# Patient Record
Sex: Female | Born: 1965 | Race: White | Hispanic: No | Marital: Single | Smoking: Never smoker
Health system: Southern US, Community
[De-identification: ages and names within clinical notes are randomized; demographics above are authoritative.]

## PROBLEM LIST (undated history)

## (undated) DIAGNOSIS — R079 Chest pain, unspecified: Secondary | ICD-10-CM

## (undated) DIAGNOSIS — R0600 Dyspnea, unspecified: Secondary | ICD-10-CM

## (undated) DIAGNOSIS — J40 Bronchitis, not specified as acute or chronic: Secondary | ICD-10-CM

## (undated) DIAGNOSIS — G43909 Migraine, unspecified, not intractable, without status migrainosus: Secondary | ICD-10-CM

## (undated) DIAGNOSIS — G709 Myoneural disorder, unspecified: Secondary | ICD-10-CM

## (undated) DIAGNOSIS — B009 Herpesviral infection, unspecified: Secondary | ICD-10-CM

## (undated) DIAGNOSIS — A6000 Herpesviral infection of urogenital system, unspecified: Secondary | ICD-10-CM

## (undated) HISTORY — DX: Migraine, unspecified, not intractable, without status migrainosus: G43.909

## (undated) HISTORY — PX: WISDOM TOOTH EXTRACTION: SHX21

## (undated) HISTORY — DX: Bronchitis, not specified as acute or chronic: J40

## (undated) HISTORY — DX: Herpesviral infection, unspecified: B00.9

---

## 2005-01-07 ENCOUNTER — Ambulatory Visit: Payer: Self-pay | Admitting: Obstetrics & Gynecology

## 2005-03-02 ENCOUNTER — Ambulatory Visit: Payer: Self-pay | Admitting: Unknown Physician Specialty

## 2006-05-30 ENCOUNTER — Ambulatory Visit: Payer: Self-pay | Admitting: Unknown Physician Specialty

## 2007-08-01 ENCOUNTER — Ambulatory Visit: Payer: Self-pay | Admitting: Unknown Physician Specialty

## 2007-08-13 ENCOUNTER — Ambulatory Visit: Payer: Self-pay | Admitting: Unknown Physician Specialty

## 2009-08-11 ENCOUNTER — Ambulatory Visit: Payer: Self-pay | Admitting: Unknown Physician Specialty

## 2010-08-17 ENCOUNTER — Ambulatory Visit: Payer: Self-pay | Admitting: Unknown Physician Specialty

## 2011-08-01 ENCOUNTER — Ambulatory Visit: Payer: Self-pay

## 2013-04-26 HISTORY — PX: INCONTINENCE SURGERY: SHX676

## 2014-02-04 ENCOUNTER — Ambulatory Visit: Payer: Self-pay | Admitting: Physician Assistant

## 2015-06-16 ENCOUNTER — Other Ambulatory Visit: Payer: Self-pay | Admitting: Physician Assistant

## 2015-06-16 DIAGNOSIS — R109 Unspecified abdominal pain: Secondary | ICD-10-CM

## 2015-06-19 ENCOUNTER — Ambulatory Visit: Payer: Self-pay

## 2015-06-22 ENCOUNTER — Other Ambulatory Visit: Payer: Self-pay | Admitting: Physician Assistant

## 2015-06-22 DIAGNOSIS — R1013 Epigastric pain: Secondary | ICD-10-CM

## 2015-06-23 ENCOUNTER — Ambulatory Visit: Admission: RE | Admit: 2015-06-23 | Payer: 59 | Source: Ambulatory Visit

## 2015-07-01 ENCOUNTER — Ambulatory Visit: Payer: 59

## 2015-07-07 ENCOUNTER — Ambulatory Visit
Admission: RE | Admit: 2015-07-07 | Discharge: 2015-07-07 | Disposition: A | Discharge status: Home / Self Care | Payer: 59 | Source: Ambulatory Visit | Attending: Physician Assistant | Admitting: Physician Assistant

## 2015-07-07 DIAGNOSIS — R932 Abnormal findings on diagnostic imaging of liver and biliary tract: Secondary | ICD-10-CM | POA: Insufficient documentation

## 2015-07-07 DIAGNOSIS — R1013 Epigastric pain: Secondary | ICD-10-CM | POA: Insufficient documentation

## 2016-04-25 ENCOUNTER — Other Ambulatory Visit: Payer: Self-pay | Admitting: Obstetrics and Gynecology

## 2016-04-25 MED ORDER — VALACYCLOVIR HCL 500 MG PO TABS: ORAL_TABLET | ORAL | 0 refills | Status: DC

## 2016-04-25 NOTE — Progress Notes (Unsigned)
valacy

## 2016-05-02 ENCOUNTER — Ambulatory Visit: Payer: Self-pay | Admitting: Obstetrics and Gynecology

## 2016-05-09 DIAGNOSIS — G43909 Migraine, unspecified, not intractable, without status migrainosus: Secondary | ICD-10-CM

## 2016-05-09 DIAGNOSIS — R0602 Shortness of breath: Secondary | ICD-10-CM | POA: Insufficient documentation

## 2016-05-09 DIAGNOSIS — R0600 Dyspnea, unspecified: Secondary | ICD-10-CM

## 2016-05-09 DIAGNOSIS — R079 Chest pain, unspecified: Secondary | ICD-10-CM | POA: Insufficient documentation

## 2016-05-09 HISTORY — DX: Dyspnea, unspecified: R06.00

## 2016-05-09 HISTORY — DX: Migraine, unspecified, not intractable, without status migrainosus: G43.909

## 2016-05-17 ENCOUNTER — Ambulatory Visit (INDEPENDENT_AMBULATORY_CARE_PROVIDER_SITE_OTHER): Payer: 59 | Admitting: Obstetrics and Gynecology

## 2016-05-17 ENCOUNTER — Encounter: Payer: Self-pay | Admitting: Obstetrics and Gynecology

## 2016-05-17 VITALS — BP 110/80 | HR 78 | Ht 70.0 in | Wt 225.0 lb

## 2016-05-17 DIAGNOSIS — Z1239 Encounter for other screening for malignant neoplasm of breast: Secondary | ICD-10-CM

## 2016-05-17 DIAGNOSIS — G43019 Migraine without aura, intractable, without status migrainosus: Secondary | ICD-10-CM | POA: Insufficient documentation

## 2016-05-17 DIAGNOSIS — Z01419 Encounter for gynecological examination (general) (routine) without abnormal findings: Secondary | ICD-10-CM

## 2016-05-17 DIAGNOSIS — Z1211 Encounter for screening for malignant neoplasm of colon: Secondary | ICD-10-CM

## 2016-05-17 DIAGNOSIS — A6004 Herpesviral vulvovaginitis: Secondary | ICD-10-CM

## 2016-05-17 DIAGNOSIS — Z1231 Encounter for screening mammogram for malignant neoplasm of breast: Secondary | ICD-10-CM

## 2016-05-17 LAB — HEMOCCULT GUIAC POC 1CARD (OFFICE): Fecal Occult Blood, POC: NEGATIVE

## 2016-05-17 MED ORDER — VALACYCLOVIR HCL 500 MG PO TABS: ORAL_TABLET | ORAL | 12 refills | Status: AC

## 2016-05-17 NOTE — Progress Notes (Signed)
HPI:      Ms. SHAYANNE GOMM is a 51 y.o. No obstetric history on file. who LMP was Patient's last menstrual period was 01/11/2016., presents today for her annual examination.  Her menses are absent since 11/17. She does not have intermenstrual bleeding.  She does not have vasomotor sx.   Sex activity: single partner. She does not have vaginal dryness. She can feel something vaginally when she strains for BM or during sex. It is not painful. She denies incontinence unless she waits too long to void. She is s/p bladder tack with mesh surgery about 3 yrs ago.  Last Pap: March 17, 2015  Results were: no abnormalities /neg HPV DNA.  Hx of STDs: HSV. She takes valtrex 1000 mg daily (but likes to take 2-500 mg tabs instead of 02-998 mg tab)  Last mammogram: March 17, 2015  Results were: normal--routine follow-up in 12 months There is no FH of breast cancer. There is no FH of ovarian cancer. The patient does do self-breast exams.  Colonoscopy: never. Pt interested in referrral.   Tobacco use: The patient denies current or previous tobacco use. Alcohol use: none Exercise: not active  She does get adequate calcium and Vitamin D in her diet.    Past Medical History:  Diagnosis Date  . Bronchitis   . Herpes   . Migraine     Past Surgical History:  Procedure Laterality Date  . CESAREAN SECTION      Family History  Problem Relation Age of Onset  . Coronary artery disease Father   . Prostate cancer Father 38  . Lung cancer Maternal Grandmother 78  . Heart attack Paternal Grandfather   . Brain cancer Paternal Grandmother 66     ROS:  Review of Systems  Constitutional: Negative for fever, malaise/fatigue and weight loss.  HENT: Negative for congestion, ear pain and sinus pain.   Respiratory: Negative for cough, shortness of breath and wheezing.   Cardiovascular: Negative for chest pain, orthopnea and leg swelling.  Gastrointestinal: Negative for constipation, diarrhea,  nausea and vomiting.  Genitourinary: Negative for dysuria, frequency, hematuria and urgency.       Breast ROS: negative   Musculoskeletal: Negative for back pain, joint pain and myalgias.  Skin: Negative for itching and rash.  Neurological: Negative for dizziness, tingling, focal weakness and headaches.  Endo/Heme/Allergies: Negative for environmental allergies. Does not bruise/bleed easily.  Psychiatric/Behavioral: Negative for depression and suicidal ideas. The patient is not nervous/anxious and does not have insomnia.     Objective: BP 110/80   Pulse 78   Ht 5\' 10"  (1.778 m)   Wt 225 lb (102.1 kg)   LMP 01/11/2016   BMI 32.28 kg/m    Physical Exam  Constitutional: She is oriented to person, place, and time. She appears well-developed and well-nourished.  Genitourinary: Vagina normal and uterus normal. No erythema or tenderness in the vagina. No vaginal discharge found. Right adnexum does not display mass and does not display tenderness. Left adnexum does not display mass and does not display tenderness. Cervix does not exhibit motion tenderness or polyp. Uterus is not enlarged or tender. Rectal exam shows no external hemorrhoid and guaiac negative stool.  Genitourinary Comments: Grade 1 cystocele with valsalva  Neck: Normal range of motion. No thyromegaly present.  Cardiovascular: Normal rate, regular rhythm and normal heart sounds.   No murmur heard. Pulmonary/Chest: Effort normal and breath sounds normal. Right breast exhibits no mass, no nipple discharge, no skin change and  no tenderness. Left breast exhibits no mass, no nipple discharge, no skin change and no tenderness.  Abdominal: Soft. There is no tenderness. There is no guarding.  Musculoskeletal: Normal range of motion.  Neurological: She is alert and oriented to person, place, and time. No cranial nerve deficit.  Psychiatric: She has a normal mood and affect. Her behavior is normal.  Vitals  reviewed.   Results: Results for orders placed or performed in visit on 05/17/16  POCT Occult Blood Stool  Result Value Ref Range   Fecal Occult Blood, POC Negative Negative   Card #1 Date     Card #2 Fecal Occult Blod, POC     Card #2 Date     Card #3 Fecal Occult Blood, POC     Card #3 Date      Assessment/Plan:  Encounter for annual routine gynecological examination  Screening for breast cancer - Plan: MM SCREENING BREAST TOMO BILATERAL  Herpes simplex vulvovaginitis - Rx RF valtrex. Pt takes 1-2 tabs daily as preventive. - Plan: valACYclovir (VALTREX) 500 MG tablet  Screening for colon cancer - Neg FOBT. Refer to GI for scr colonoscopy due to age. - Plan: POCT Occult Blood Stool, Ambulatory referral to Gastroenterology           GYN counsel mammography screening, menopause, adequate intake of calcium and vitamin D     F/U  Return in about 1 year (around 05/17/2017).  Alicia B. Copland, PA-C 05/17/2016 12:04 PM

## 2017-04-10 ENCOUNTER — Encounter: Payer: Self-pay | Admitting: Anesthesiology

## 2017-04-10 ENCOUNTER — Ambulatory Visit: Payer: 59 | Admitting: Anesthesiology

## 2017-04-10 ENCOUNTER — Ambulatory Visit
Admission: RE | Admit: 2017-04-10 | Discharge: 2017-04-10 | Disposition: A | Discharge status: Home / Self Care | Payer: 59 | Source: Ambulatory Visit | Attending: Unknown Physician Specialty | Admitting: Unknown Physician Specialty

## 2017-04-10 ENCOUNTER — Encounter
Admission: RE | Disposition: A | Discharge status: Home / Self Care | Payer: Self-pay | Source: Ambulatory Visit | Attending: Unknown Physician Specialty

## 2017-04-10 DIAGNOSIS — K640 First degree hemorrhoids: Secondary | ICD-10-CM | POA: Diagnosis not present

## 2017-04-10 DIAGNOSIS — Z79899 Other long term (current) drug therapy: Secondary | ICD-10-CM | POA: Insufficient documentation

## 2017-04-10 DIAGNOSIS — Z8371 Family history of colonic polyps: Secondary | ICD-10-CM | POA: Diagnosis not present

## 2017-04-10 DIAGNOSIS — D175 Benign lipomatous neoplasm of intra-abdominal organs: Secondary | ICD-10-CM | POA: Insufficient documentation

## 2017-04-10 DIAGNOSIS — Z882 Allergy status to sulfonamides status: Secondary | ICD-10-CM | POA: Insufficient documentation

## 2017-04-10 DIAGNOSIS — Z1211 Encounter for screening for malignant neoplasm of colon: Secondary | ICD-10-CM | POA: Insufficient documentation

## 2017-04-10 DIAGNOSIS — K635 Polyp of colon: Secondary | ICD-10-CM | POA: Diagnosis not present

## 2017-04-10 DIAGNOSIS — D122 Benign neoplasm of ascending colon: Secondary | ICD-10-CM | POA: Diagnosis not present

## 2017-04-10 DIAGNOSIS — A6000 Herpesviral infection of urogenital system, unspecified: Secondary | ICD-10-CM | POA: Insufficient documentation

## 2017-04-10 DIAGNOSIS — Z8249 Family history of ischemic heart disease and other diseases of the circulatory system: Secondary | ICD-10-CM | POA: Insufficient documentation

## 2017-04-10 HISTORY — DX: Dyspnea, unspecified: R06.00

## 2017-04-10 HISTORY — PX: COLONOSCOPY WITH PROPOFOL: SHX5780

## 2017-04-10 HISTORY — DX: Myoneural disorder, unspecified: G70.9

## 2017-04-10 HISTORY — DX: Herpesviral infection of urogenital system, unspecified: A60.00

## 2017-04-10 HISTORY — DX: Chest pain, unspecified: R07.9

## 2017-04-10 LAB — POCT PREGNANCY, URINE: PREG TEST UR: NEGATIVE

## 2017-04-10 SURGERY — COLONOSCOPY WITH PROPOFOL
Anesthesia: General

## 2017-04-10 MED ORDER — SODIUM CHLORIDE 0.9 % IV SOLN: INTRAVENOUS | Status: DC

## 2017-04-10 MED ORDER — MIDAZOLAM HCL 5 MG/5ML IJ SOLN
INTRAMUSCULAR | Status: DC | PRN
  Administered 2017-04-10 (×2): 2 mg via INTRAVENOUS

## 2017-04-10 MED ORDER — PROPOFOL 500 MG/50ML IV EMUL
INTRAVENOUS | Status: DC | PRN
  Administered 2017-04-10: 50 ug/kg/min via INTRAVENOUS

## 2017-04-10 MED ORDER — LIDOCAINE HCL (PF) 1 % IJ SOLN
INTRAMUSCULAR | Status: AC
  Administered 2017-04-10: 0.3 mL via INTRADERMAL
  Filled 2017-04-10: qty 2

## 2017-04-10 MED ORDER — FENTANYL CITRATE (PF) 100 MCG/2ML IJ SOLN
INTRAMUSCULAR | Status: DC | PRN
  Administered 2017-04-10 (×2): 50 ug via INTRAVENOUS

## 2017-04-10 MED ORDER — PROPOFOL 500 MG/50ML IV EMUL
INTRAVENOUS | Status: AC
  Filled 2017-04-10: qty 50

## 2017-04-10 MED ORDER — FENTANYL CITRATE (PF) 100 MCG/2ML IJ SOLN
INTRAMUSCULAR | Status: AC
  Filled 2017-04-10: qty 2

## 2017-04-10 MED ORDER — LIDOCAINE HCL (PF) 1 % IJ SOLN
2.0000 mL | Freq: Once | INTRAMUSCULAR | Status: AC
  Administered 2017-04-10: 0.3 mL via INTRADERMAL

## 2017-04-10 MED ORDER — MIDAZOLAM HCL 2 MG/2ML IJ SOLN
INTRAMUSCULAR | Status: AC
  Filled 2017-04-10: qty 2

## 2017-04-10 MED ORDER — EPHEDRINE SULFATE 50 MG/ML IJ SOLN
INTRAMUSCULAR | Status: DC | PRN
  Administered 2017-04-10: 5 mg via INTRAVENOUS
  Administered 2017-04-10: 10 mg via INTRAVENOUS

## 2017-04-10 MED ORDER — LIDOCAINE HCL (PF) 2 % IJ SOLN
INTRAMUSCULAR | Status: AC
  Filled 2017-04-10: qty 10

## 2017-04-10 MED ORDER — LIDOCAINE HCL (PF) 2 % IJ SOLN
INTRAMUSCULAR | Status: DC | PRN
  Administered 2017-04-10: 60 mg

## 2017-04-10 MED ORDER — PROPOFOL 10 MG/ML IV BOLUS
INTRAVENOUS | Status: DC | PRN
  Administered 2017-04-10: 30 mg via INTRAVENOUS
  Administered 2017-04-10 (×2): 20 mg via INTRAVENOUS
  Administered 2017-04-10: 30 mg via INTRAVENOUS

## 2017-04-10 MED ORDER — SODIUM CHLORIDE 0.9 % IV SOLN
INTRAVENOUS | Status: DC
  Administered 2017-04-10: 1000 mL via INTRAVENOUS

## 2017-04-10 NOTE — Anesthesia Post-op Follow-up Note (Signed)
Anesthesia QCDR form completed.        

## 2017-04-10 NOTE — Op Note (Signed)
[facility] Patient Name: Tracy Miller Procedure Date: 04/10/2017 12:17 PM MRN: [id number] Account #: [id number] Date of Birth: 23-Jun-1965 Admit Type: Outpatient Age: 52 Room: [facility] ENDO ROOM 1 Gender: Female Note Status: Finalized Procedure:            Colonoscopy Indications:          Colon cancer screening in patient at increased risk:                        Family history of 1st-degree relative with colon polyps Providers:            Manya Silvas, MD Referring MD:         Precious Bard, MD (Referring MD) Medicines:            Propofol per Anesthesia Complications:        No immediate complications. Procedure:            Pre-Anesthesia Assessment:                       - After reviewing the risks and benefits, the patient                        was deemed in satisfactory condition to undergo the                        procedure.                       After obtaining informed consent, the colonoscope was                        passed under direct vision. Throughout the procedure,                        the patient's blood pressure, pulse, and oxygen                        saturations were monitored continuously. The                        Colonoscope was introduced through the anus and                        advanced to the the cecum, identified by appendiceal                        orifice and ileocecal valve. The colonoscopy was                        performed without difficulty. The patient tolerated the                        procedure well. The quality of the bowel preparation                        was good. Findings:      A diminutive polyp was found in the proximal ascending colon. The polyp       was sessile. The polyp was removed with a jumbo cold forceps. Resection       and retrieval were complete.  A small polyp was found in the descending colon. The polyp was sessile.       The polyp was removed with a hot snare.  Resection and retrieval were       complete.      One small nodule was found in the sigmoid colon. Biopsies were taken       with a cold forceps for histology.      Internal hemorrhoids were found during endoscopy. The hemorrhoids were       small and Grade I (internal hemorrhoids that do not prolapse).      The exam was otherwise without abnormality. Impression:           - One diminutive polyp in the proximal ascending colon,                        removed with a jumbo cold forceps. Resected and                        retrieved.                       - One small polyp in the descending colon, removed with                        a hot snare. Resected and retrieved.                       - Nodule in the sigmoid colon. Biopsied.                       - Internal hemorrhoids.                       - The examination was otherwise normal. Recommendation:       - Await pathology results. Manya Silvas, MD 04/10/2017 12:59:37 PM This report has been signed electronically. Number of Addenda: 0 Note Initiated On: 04/10/2017 12:17 PM Scope Withdrawal Time: 0 hours 19 minutes 24 seconds  Total Procedure Duration: 0 hours 29 minutes 18 seconds       [facility]

## 2017-04-10 NOTE — Anesthesia Preprocedure Evaluation (Addendum)
Anesthesia Evaluation  Patient identified by MRN, date of birth, ID band Patient awake    Reviewed: Allergy & Precautions, NPO status , Patient's Chart, lab work & pertinent test results, reviewed documented beta blocker date and time   Airway Mallampati: III  TM Distance: >3 FB     Dental  (+) Chipped, Missing   Pulmonary shortness of breath,           Cardiovascular      Neuro/Psych  Headaches,  Neuromuscular disease    GI/Hepatic   Endo/Other    Renal/GU      Musculoskeletal   Abdominal   Peds  Hematology   Anesthesia Other Findings One missing tooth. Does not take NTG.  Reproductive/Obstetrics                            Anesthesia Physical Anesthesia Plan  ASA: III  Anesthesia Plan: General   Post-op Pain Management:    Induction: Intravenous  PONV Risk Score and Plan:   Airway Management Planned:   Additional Equipment:   Intra-op Plan:   Post-operative Plan:   Informed Consent: I have reviewed the patients History and Physical, chart, labs and discussed the procedure including the risks, benefits and alternatives for the proposed anesthesia with the patient or authorized representative who has indicated his/her understanding and acceptance.     Plan Discussed with: CRNA  Anesthesia Plan Comments:         Anesthesia Quick Evaluation

## 2017-04-10 NOTE — Anesthesia Postprocedure Evaluation (Signed)
Anesthesia Post Note  Patient: Tracy Miller  Procedure(s) Performed: COLONOSCOPY WITH PROPOFOL (N/A )  Patient location during evaluation: Endoscopy Anesthesia Type: General Level of consciousness: awake and alert Pain management: pain level controlled Vital Signs Assessment: post-procedure vital signs reviewed and stable Respiratory status: spontaneous breathing, nonlabored ventilation, respiratory function stable and patient connected to nasal cannula oxygen Cardiovascular status: blood pressure returned to baseline and stable Postop Assessment: no apparent nausea or vomiting Anesthetic complications: no     Last Vitals:  Vitals:   04/10/17 1318 04/10/17 1328  BP: 115/79 122/80  Pulse: 65 67  Resp: 15 16  Temp:    SpO2: 99% 97%    Last Pain:  Vitals:   04/10/17 1258  TempSrc: Tympanic                 Klea Nall S

## 2017-04-10 NOTE — Transfer of Care (Addendum)
Immediate Anesthesia Transfer of Care Note  Patient: Tracy Miller  Procedure(s) Performed: COLONOSCOPY WITH PROPOFOL (N/A )  Patient Location: PACU  Anesthesia Type:General  Level of Consciousness: sedated  Airway & Oxygen Therapy: Patient Spontanous Breathing and Patient connected to nasal cannula oxygen  Post-op Assessment: Report given to RN and Post -op Vital signs reviewed and stable  Post vital signs: Reviewed and stable  Last Vitals:  Vitals:   04/10/17 1140 04/10/17 1141  BP:  127/82  Pulse:  70  Resp: 16   Temp:  (!) 36.1 C  SpO2:  100%    Last Pain: There were no vitals filed for this visit.       Complications: No apparent anesthesia complications

## 2017-04-10 NOTE — H&P (Signed)
Primary Care Physician:  Marinda Elk, MD Primary Gastroenterologist:  Dr. Vira Agar  Pre-Procedure History & Physical: HPI:  Tracy Miller is a 52 y.o. female is here for an colonoscopy.  Done for family history of colon polyps.   Past Medical History:  Diagnosis Date  . Bronchitis   . Chest pain syndrome   . Dyspnea 05/09/2016  . Genital herpes   . Herpes   . Migraine 05/09/2016  . Neuromuscular disorder ([place])    Migraine    Past Surgical History:  Procedure Laterality Date  . CESAREAN SECTION    . INCONTINENCE SURGERY  04/26/2013   cystoscopy with ureteroscopy  . WISDOM TOOTH EXTRACTION      Prior to Admission medications   Medication Sig Start Date End Date Taking? Authorizing Provider  BIOTIN PO Take by mouth.   Yes [provider]  cyclobenzaprine (FLEXERIL) 5 MG tablet Take 5 mg by mouth 2 (two) times daily as needed for muscle spasms.   Yes [provider]  ibuprofen (ADVIL,MOTRIN) 600 MG tablet Take 600 mg by mouth every 6 (six) hours as needed.   Yes [provider]  Multiple Vitamin (MULTI-VITAMIN DAILY PO) Take by mouth.   Yes [provider]  nitroGLYCERIN (NITROSTAT) 0.4 MG SL tablet  05/09/16  Yes [provider]  TURMERIC PO Take 1 capsule by mouth daily.   Yes [provider]  valACYclovir (VALTREX) 500 MG tablet Take 1-2 tabs daily 07/17/76  Yes Copland, Alicia B, PA-C  Cholecalciferol (VITAMIN D3) 1000 units CAPS Take by mouth.    [provider]  zolpidem (AMBIEN) 5 MG tablet  02/10/16   [provider]    Allergies as of 02/06/2017 - Review Complete 05/17/2016  Allergen Reaction Noted  . Sulfa antibiotics Rash 06/13/2014    Family History  Problem Relation Age of Onset  . Coronary artery disease Father   . Prostate cancer Father 70  . Lung cancer Maternal Grandmother 78  . Heart attack Paternal Grandfather   . Brain cancer Paternal Grandmother 23    Social History    Socioeconomic History  . Marital status: Single    Spouse name: Not on file  . Number of children: Not on file  . Years of education: Not on file  . Highest education level: Not on file  Social Needs  . Financial resource strain: Not on file  . Food insecurity - worry: Not on file  . Food insecurity - inability: Not on file  . Transportation needs - medical: Not on file  . Transportation needs - non-medical: Not on file  Occupational History  . Not on file  Tobacco Use  . Smoking status: Never Smoker  . Smokeless tobacco: Never Used  Substance and Sexual Activity  . Alcohol use: Yes    Comment: 2-4 drinks per month  . Drug use: No  . Sexual activity: Yes    Birth control/protection: None  Other Topics Concern  . Not on file  Social History Narrative  . Not on file    Review of Systems: See HPI, otherwise negative ROS  Physical Exam: BP 127/82   Pulse 70   Temp (!) 97 F (36.1 C)   Resp 16   Ht 5\' 10"  (1.778 m)   Wt 103 kg (227 lb)   SpO2 100%   BMI 32.57 kg/m  General:   Alert,  pleasant and cooperative in NAD Head:  Normocephalic and atraumatic. Neck:  Supple; no masses  or thyromegaly. Lungs:  Clear throughout to auscultation.    Heart:  Regular rate and rhythm. Abdomen:  Soft, nontender and nondistended. Normal bowel sounds, without guarding, and without rebound.   Neurologic:  Alert and  oriented x4;  grossly normal neurologically.  Impression/Plan: Tracy Miller is here for an colonoscopy to be performed for screening colonoscopy with family history of colon polyps in father.  Risks, benefits, limitations, and alternatives regarding  colonoscopy have been reviewed with the patient.  Questions have been answered.  All parties agreeable.   Gaylyn Cheers, MD  04/10/2017, 12:17 PM

## 2017-04-11 ENCOUNTER — Encounter: Payer: Self-pay | Admitting: Unknown Physician Specialty

## 2017-04-11 LAB — SURGICAL PATHOLOGY

## 2017-05-16 ENCOUNTER — Encounter (HOSPITAL_COMMUNITY): Payer: Self-pay

## 2017-05-16 ENCOUNTER — Ambulatory Visit
Admission: RE | Admit: 2017-05-16 | Discharge: 2017-05-16 | Disposition: A | Discharge status: Home / Self Care | Payer: 59 | Source: Ambulatory Visit | Attending: Obstetrics and Gynecology | Admitting: Obstetrics and Gynecology

## 2017-05-16 DIAGNOSIS — Z1231 Encounter for screening mammogram for malignant neoplasm of breast: Secondary | ICD-10-CM | POA: Diagnosis not present

## 2017-05-16 DIAGNOSIS — Z1239 Encounter for other screening for malignant neoplasm of breast: Secondary | ICD-10-CM

## 2017-05-28 ENCOUNTER — Other Ambulatory Visit: Payer: Self-pay | Admitting: Obstetrics and Gynecology

## 2017-05-28 DIAGNOSIS — A6004 Herpesviral vulvovaginitis: Secondary | ICD-10-CM

## 2017-05-31 ENCOUNTER — Inpatient Hospital Stay
Admission: RE | Admit: 2017-05-31 | Discharge: 2017-05-31 | Disposition: A | Discharge status: Home / Self Care | Payer: Self-pay | Source: Ambulatory Visit | Attending: *Deleted | Admitting: *Deleted

## 2017-05-31 ENCOUNTER — Other Ambulatory Visit: Payer: Self-pay | Admitting: *Deleted

## 2017-05-31 ENCOUNTER — Encounter: Payer: Self-pay | Admitting: Obstetrics and Gynecology

## 2017-05-31 DIAGNOSIS — Z9289 Personal history of other medical treatment: Secondary | ICD-10-CM

## 2018-01-24 ENCOUNTER — Other Ambulatory Visit: Payer: Self-pay | Admitting: Physician Assistant

## 2018-01-24 DIAGNOSIS — R053 Chronic cough: Secondary | ICD-10-CM

## 2018-01-24 DIAGNOSIS — R05 Cough: Secondary | ICD-10-CM

## 2018-02-12 ENCOUNTER — Ambulatory Visit: Payer: 59

## 2018-02-15 ENCOUNTER — Telehealth: Payer: Self-pay

## 2018-02-28 ENCOUNTER — Other Ambulatory Visit: Payer: Self-pay | Admitting: Nurse Practitioner

## 2018-02-28 DIAGNOSIS — R053 Chronic cough: Secondary | ICD-10-CM

## 2018-02-28 DIAGNOSIS — R05 Cough: Secondary | ICD-10-CM

## 2018-03-06 ENCOUNTER — Ambulatory Visit
Admission: RE | Admit: 2018-03-06 | Discharge: 2018-03-06 | Disposition: A | Discharge status: Home / Self Care | Payer: 59 | Source: Ambulatory Visit | Attending: Nurse Practitioner | Admitting: Nurse Practitioner

## 2018-03-06 DIAGNOSIS — R05 Cough: Secondary | ICD-10-CM

## 2018-03-06 DIAGNOSIS — R053 Chronic cough: Secondary | ICD-10-CM

## 2018-03-29 ENCOUNTER — Other Ambulatory Visit: Payer: Self-pay | Admitting: Physician Assistant

## 2018-03-29 DIAGNOSIS — R05 Cough: Secondary | ICD-10-CM

## 2018-03-29 DIAGNOSIS — R053 Chronic cough: Secondary | ICD-10-CM

## 2018-04-06 ENCOUNTER — Ambulatory Visit: Payer: 59

## 2018-04-06 ENCOUNTER — Other Ambulatory Visit: Payer: 59

## 2018-04-10 ENCOUNTER — Ambulatory Visit
Admission: RE | Admit: 2018-04-10 | Discharge: 2018-04-10 | Disposition: A | Discharge status: Home / Self Care | Payer: 59 | Source: Ambulatory Visit | Attending: Physician Assistant | Admitting: Physician Assistant

## 2018-04-10 DIAGNOSIS — R053 Chronic cough: Secondary | ICD-10-CM

## 2018-04-10 DIAGNOSIS — R05 Cough: Secondary | ICD-10-CM

## 2018-04-10 MED ORDER — IOPAMIDOL (ISOVUE-300) INJECTION 61%
75.0000 mL | Freq: Once | INTRAVENOUS | Status: AC | PRN
  Administered 2018-04-10: 75 mL via INTRAVENOUS

## 2018-04-19 ENCOUNTER — Other Ambulatory Visit: Payer: Self-pay | Admitting: Obstetrics & Gynecology

## 2018-04-19 ENCOUNTER — Other Ambulatory Visit: Payer: Self-pay | Admitting: Obstetrics and Gynecology

## 2018-04-19 DIAGNOSIS — Z1231 Encounter for screening mammogram for malignant neoplasm of breast: Secondary | ICD-10-CM

## 2018-04-30 ENCOUNTER — Encounter: Payer: Self-pay | Admitting: Pulmonary Disease

## 2018-05-24 NOTE — Telephone Encounter (Signed)
Nothing further needed 

## 2018-07-03 ENCOUNTER — Inpatient Hospital Stay: Admission: RE | Admit: 2018-07-03 | Payer: 59 | Source: Ambulatory Visit

## 2018-07-31 ENCOUNTER — Other Ambulatory Visit: Payer: Self-pay | Admitting: Pulmonary Disease

## 2018-07-31 DIAGNOSIS — R0602 Shortness of breath: Secondary | ICD-10-CM

## 2018-09-11 ENCOUNTER — Ambulatory Visit
Admission: RE | Admit: 2018-09-11 | Discharge: 2018-09-11 | Disposition: A | Discharge status: Home / Self Care | Payer: 59 | Source: Ambulatory Visit | Attending: Obstetrics & Gynecology | Admitting: Obstetrics & Gynecology

## 2018-09-11 DIAGNOSIS — Z1231 Encounter for screening mammogram for malignant neoplasm of breast: Secondary | ICD-10-CM | POA: Insufficient documentation

## 2018-10-15 ENCOUNTER — Ambulatory Visit
Admission: RE | Admit: 2018-10-15 | Discharge: 2018-10-15 | Disposition: A | Discharge status: Home / Self Care | Payer: 59 | Source: Ambulatory Visit | Attending: Pulmonary Disease | Admitting: Pulmonary Disease

## 2018-10-15 ENCOUNTER — Other Ambulatory Visit: Payer: Self-pay

## 2018-10-15 DIAGNOSIS — R0602 Shortness of breath: Secondary | ICD-10-CM | POA: Insufficient documentation

## 2018-10-15 MED ORDER — IOHEXOL 300 MG/ML  SOLN
75.0000 mL | Freq: Once | INTRAMUSCULAR | Status: AC | PRN
  Administered 2018-10-15: 75 mL via INTRAVENOUS

## 2019-11-18 ENCOUNTER — Other Ambulatory Visit: Payer: Self-pay | Admitting: Physician Assistant

## 2019-11-18 DIAGNOSIS — Z1231 Encounter for screening mammogram for malignant neoplasm of breast: Secondary | ICD-10-CM

## 2019-12-30 ENCOUNTER — Other Ambulatory Visit: Payer: Self-pay

## 2019-12-30 ENCOUNTER — Ambulatory Visit
Admission: RE | Admit: 2019-12-30 | Discharge: 2019-12-30 | Disposition: A | Discharge status: Home / Self Care | Payer: 59 | Source: Ambulatory Visit | Attending: Physician Assistant | Admitting: Physician Assistant

## 2019-12-30 DIAGNOSIS — Z1231 Encounter for screening mammogram for malignant neoplasm of breast: Secondary | ICD-10-CM | POA: Diagnosis present

## 2021-02-01 ENCOUNTER — Other Ambulatory Visit: Payer: Self-pay | Admitting: Physician Assistant

## 2021-02-01 DIAGNOSIS — Z1231 Encounter for screening mammogram for malignant neoplasm of breast: Secondary | ICD-10-CM

## 2022-04-22 IMAGING — MG DIGITAL SCREENING BILAT W/ TOMO W/ CAD
8 series · 8 of 24 positions shown · non-contrast
Comparison: Previous exam(s).

CLINICAL DATA: Screening.

EXAM:
DIGITAL SCREENING BILATERAL MAMMOGRAM WITH TOMO AND CAD

[R MLO synth-2D]
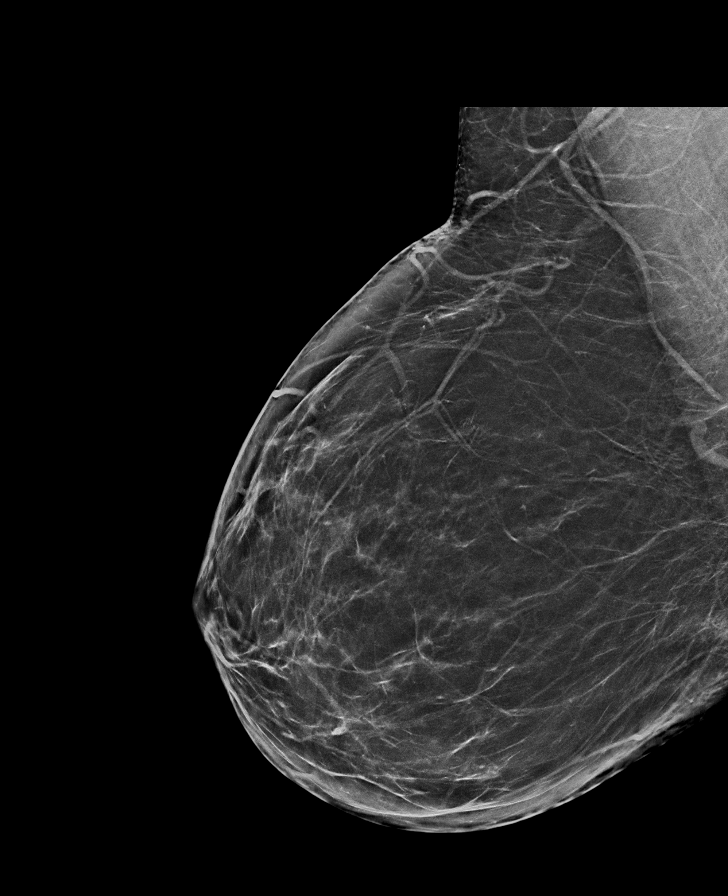

[R CC synth-2D]
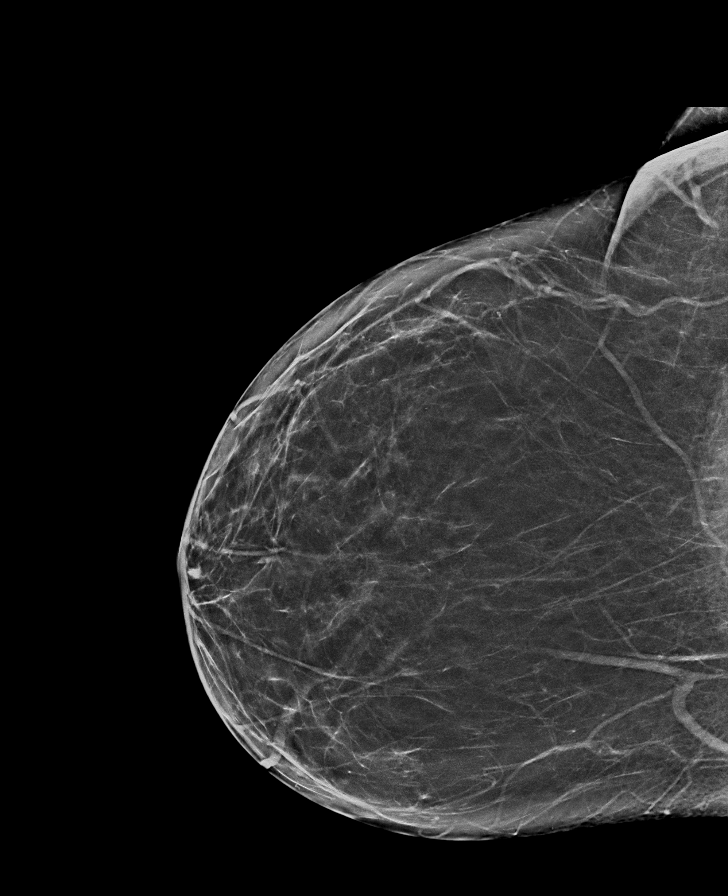

[L CC synth-2D]
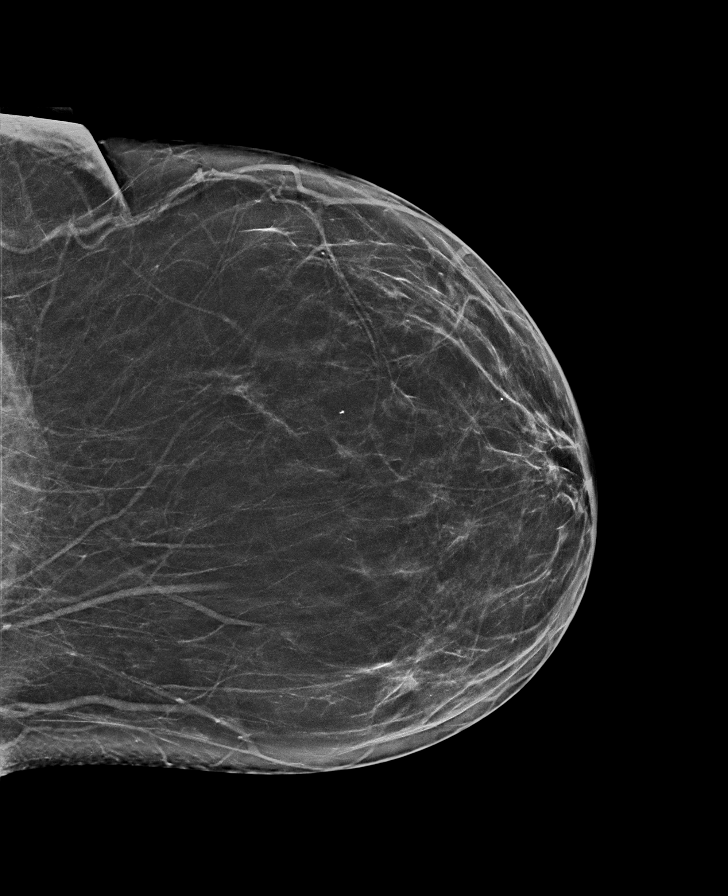

[L MLO synth-2D]
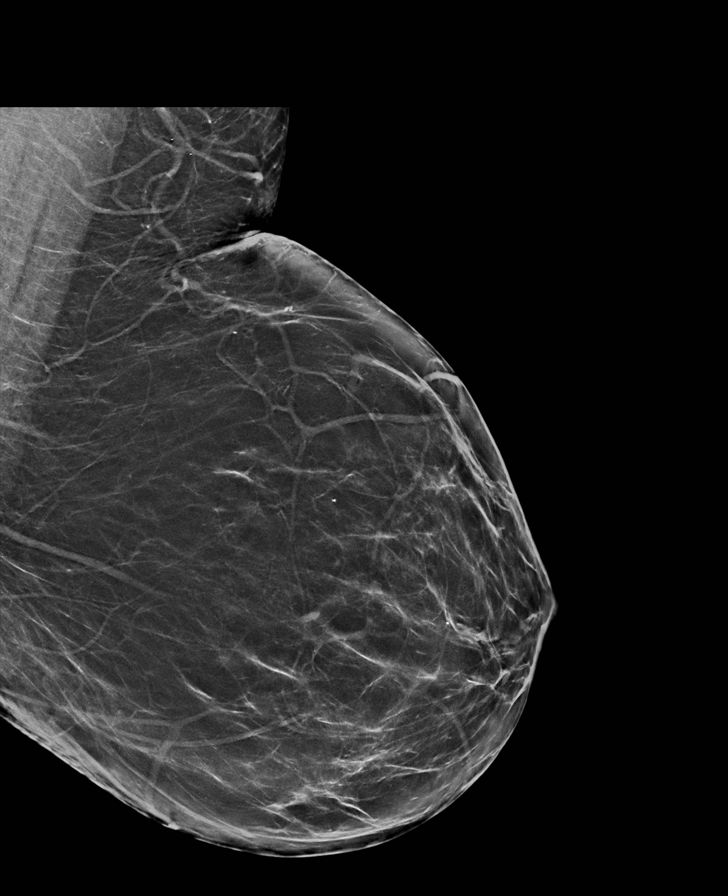

[R CC tomo · tomo slice 35/68.0]
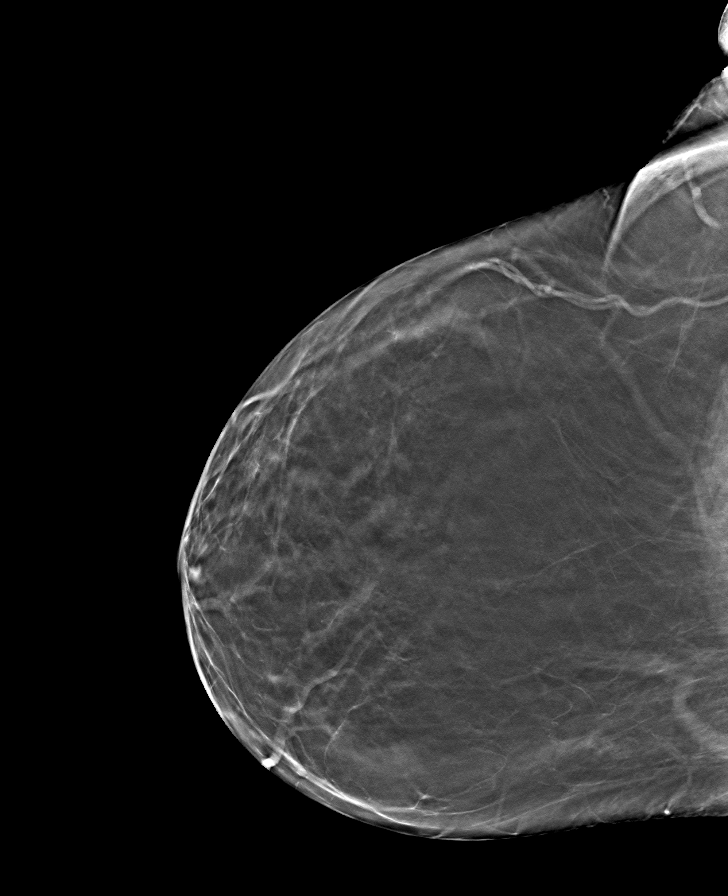

[R MLO tomo · tomo slice 35/70.0]
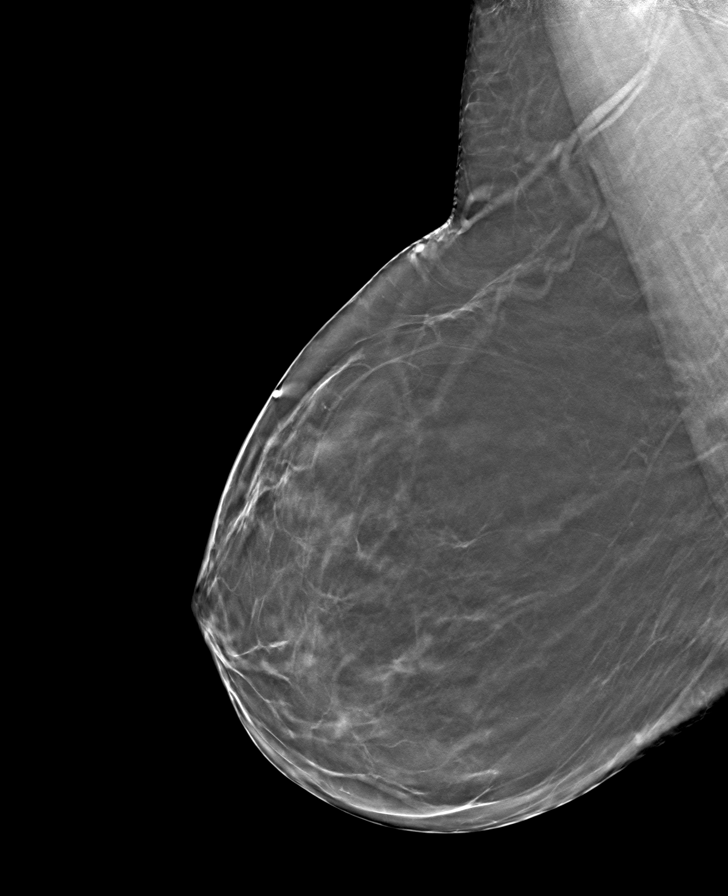

[L MLO tomo · tomo slice 37/72.0]
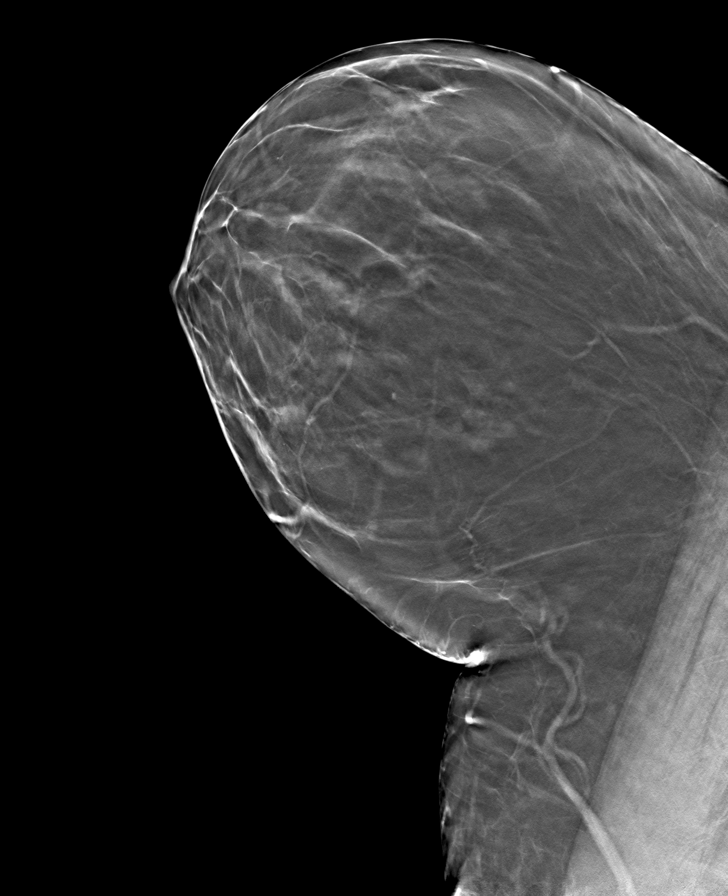

[L CC tomo · tomo slice 35/68.0]
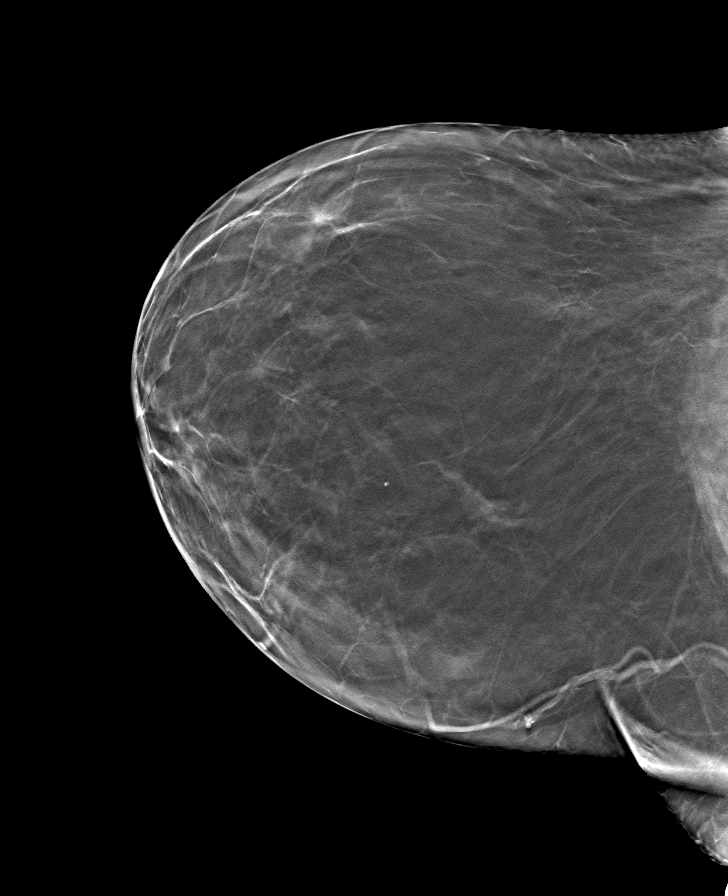

[8 of 24 positions shown; findings below may reference images not displayed]

ACR Breast Density Category b: There are scattered areas of
fibroglandular density.
FINDINGS: There are no findings suspicious for malignancy. Images were
processed with CAD.
IMPRESSION: No mammographic evidence of malignancy. A result letter of this
screening mammogram will be mailed directly to the patient.

RECOMMENDATION:
Screening mammogram in one year. (Code:[id number])

BI-RADS CATEGORY  1: Negative.
